# Patient Record
Sex: Male | Born: 1945 | Race: White | Hispanic: No | Marital: Married
Health system: Southern US, Community
[De-identification: ages and names within clinical notes are randomized; demographics above are authoritative.]

---

## 2016-10-13 ENCOUNTER — Emergency Department (HOSPITAL_COMMUNITY): Payer: Medicare Other

## 2016-10-13 ENCOUNTER — Encounter (HOSPITAL_COMMUNITY): Payer: Self-pay | Admitting: Radiology

## 2016-10-13 ENCOUNTER — Emergency Department (HOSPITAL_COMMUNITY)
Admission: EM | Admit: 2016-10-13 | Discharge: 2016-10-14 | Disposition: A | Discharge status: Home / Self Care | Payer: Medicare Other | Attending: Emergency Medicine | Admitting: Emergency Medicine

## 2016-10-13 DIAGNOSIS — R531 Weakness: Secondary | ICD-10-CM | POA: Diagnosis present

## 2016-10-13 DIAGNOSIS — D696 Thrombocytopenia, unspecified: Secondary | ICD-10-CM | POA: Insufficient documentation

## 2016-10-13 DIAGNOSIS — W57XXXA Bitten or stung by nonvenomous insect and other nonvenomous arthropods, initial encounter: Secondary | ICD-10-CM | POA: Insufficient documentation

## 2016-10-13 DIAGNOSIS — R509 Fever, unspecified: Secondary | ICD-10-CM | POA: Diagnosis not present

## 2016-10-13 LAB — URINALYSIS, ROUTINE W REFLEX MICROSCOPIC
BILIRUBIN URINE: NEGATIVE
GLUCOSE, UA: NEGATIVE mg/dL
HGB URINE DIPSTICK: NEGATIVE
KETONES UR: 20 mg/dL — AB
Leukocytes, UA: NEGATIVE
Nitrite: NEGATIVE
PH: 5 (ref 5.0–8.0)
Protein, ur: NEGATIVE mg/dL
SPECIFIC GRAVITY, URINE: 1.018 (ref 1.005–1.030)

## 2016-10-13 LAB — COMPREHENSIVE METABOLIC PANEL
ALBUMIN: 3.7 g/dL (ref 3.5–5.0)
ALK PHOS: 59 U/L (ref 38–126)
ALT: 20 U/L (ref 17–63)
AST: 27 U/L (ref 15–41)
Anion gap: 8 (ref 5–15)
BUN: 11 mg/dL (ref 6–20)
CALCIUM: 8.6 mg/dL — AB (ref 8.9–10.3)
CHLORIDE: 101 mmol/L (ref 101–111)
CO2: 25 mmol/L (ref 22–32)
CREATININE: 1.24 mg/dL (ref 0.61–1.24)
GFR calc non Af Amer: 57 mL/min — ABNORMAL LOW (ref >60)
GLUCOSE: 132 mg/dL — AB (ref 65–99)
Potassium: 4 mmol/L (ref 3.5–5.1)
SODIUM: 134 mmol/L — AB (ref 135–145)
Total Bilirubin: 1.5 mg/dL — ABNORMAL HIGH (ref 0.3–1.2)
Total Protein: 6.7 g/dL (ref 6.5–8.1)

## 2016-10-13 LAB — CBC WITH DIFFERENTIAL/PLATELET
BASOS ABS: 0 10*3/uL (ref 0.0–0.1)
Basophils Relative: 1 %
EOS ABS: 0 10*3/uL (ref 0.0–0.7)
Eosinophils Relative: 0 %
HCT: 43.4 % (ref 39.0–52.0)
Hemoglobin: 14.8 g/dL (ref 13.0–17.0)
LYMPHS ABS: 0.4 10*3/uL — AB (ref 0.7–4.0)
Lymphocytes Relative: 13 %
MCH: 29.7 pg (ref 26.0–34.0)
MCHC: 34.1 g/dL (ref 30.0–36.0)
MCV: 87 fL (ref 78.0–100.0)
Monocytes Absolute: 0.2 10*3/uL (ref 0.1–1.0)
Monocytes Relative: 6 %
NEUTROS PCT: 79 %
Neutro Abs: 2.4 10*3/uL (ref 1.7–7.7)
Platelets: 97 10*3/uL — ABNORMAL LOW (ref 150–400)
RBC: 4.99 MIL/uL (ref 4.22–5.81)
RDW: 13.1 % (ref 11.5–15.5)
WBC: 3 10*3/uL — AB (ref 4.0–10.5)

## 2016-10-13 LAB — LIPASE, BLOOD: Lipase: 30 U/L (ref 11–51)

## 2016-10-13 LAB — TROPONIN I: Troponin I: <0.03 ng/mL (ref <0.03)

## 2016-10-13 MED ORDER — SODIUM CHLORIDE 0.9 % IV BOLUS (SEPSIS)
1000.0000 mL | Freq: Once | INTRAVENOUS | Status: AC
  Administered 2016-10-13: 1000 mL via INTRAVENOUS

## 2016-10-13 MED ORDER — ACETAMINOPHEN 500 MG PO TABS
1000.0000 mg | ORAL_TABLET | Freq: Once | ORAL | Status: AC
  Administered 2016-10-13: 1000 mg via ORAL
  Filled 2016-10-13: qty 2

## 2016-10-13 MED ORDER — DOXYCYCLINE HYCLATE 100 MG IV SOLR
100.0000 mg | Freq: Once | INTRAVENOUS | Status: AC
  Administered 2016-10-13: 100 mg via INTRAVENOUS
  Filled 2016-10-13: qty 100

## 2016-10-13 MED ORDER — SODIUM CHLORIDE 0.9 % IV BOLUS (SEPSIS)
500.0000 mL | Freq: Once | INTRAVENOUS | Status: AC
  Administered 2016-10-13: 500 mL via INTRAVENOUS

## 2016-10-13 MED ORDER — IOPAMIDOL (ISOVUE-300) INJECTION 61%
INTRAVENOUS | Status: AC
  Administered 2016-10-13: 100 mL
  Filled 2016-10-13: qty 100

## 2016-10-13 NOTE — ED Triage Notes (Addendum)
Pt presents with generalized weakness, headache and L flank pain x 3-4 days.  Pt denies any back injury, was seen at [facility] yesterday, placed on zofran and doxy for possible lyme disease.  Pt reports over the past few months, he has been bitten x 4 by ticks.  Pt reports syncopal episode while at doctor's office yesterday, worked up at [employer] and referred here after discharge.

## 2016-10-13 NOTE — ED Provider Notes (Addendum)
[place] DEPT Provider Note   CSN: [id number] Arrival date & time: 10/13/16  1222     History   Chief Complaint Chief Complaint  Patient presents with  . Weakness    HPI Donald Potter is a 71 y.o. male.  Patient c/o generalized weakness for the past 3-4 days.  C/o intermittent dull frontal headaches, general/intermittent abdominal discomfort, nausea, decreased appetite. Denies vomiting or diarrhea. Denies dysuria or gu c/o. No prior abd surgery. Pt denies neck pain or stiffness. No chest pain or discomfort. Occasional non prod cough. No sore throat. Denies sinus pain. No skin rash/lesions. States had a few tick bites 3 months ago.  Denies rash. Denies joint pain. No fever or chills. No recent change in meds. States went to [facility] yesterday, no specific dx made.    The history is provided by the patient.  Weakness  Pertinent negatives include no shortness of breath, no chest pain, no vomiting, no confusion and no headaches.    No past medical history on file.  There are no active problems to display for this patient.   No past surgical history on file.     Home Medications    Prior to Admission medications   Not on File    Family History No family history on file.  Social History Social History  Substance Use Topics  . Smoking status: Not on file  . Smokeless tobacco: Not on file  . Alcohol use Not on file     Allergies   Patient has no allergy information on record.   Review of Systems Review of Systems  Constitutional: Negative for chills and fever.  HENT: Negative for sore throat.   Eyes: Negative for redness and visual disturbance.  Respiratory: Positive for cough. Negative for shortness of breath.   Cardiovascular: Negative for chest pain.  Gastrointestinal: Positive for nausea. Negative for diarrhea and vomiting.  Endocrine: Negative for polyuria.  Genitourinary: Negative for dysuria and flank pain.  Musculoskeletal: Negative for back  pain and neck pain.  Skin: Negative for rash.  Neurological: Positive for weakness. Negative for headaches.  Hematological: Does not bruise/bleed easily.  Psychiatric/Behavioral: Negative for confusion.     Physical Exam Updated Vital Signs BP 106/88   Pulse (!) 102   Temp 98.4 F (36.9 C) (Oral)   Resp 10   Ht 1.829 m (6')   Wt 85.7 kg (189 lb)   SpO2 100%   BMI 25.63 kg/m   Physical Exam  Constitutional: He is oriented to person, place, and time. He appears well-developed and well-nourished. No distress.  HENT:  Head: Atraumatic.  Nose: Nose normal.  Mouth/Throat: Oropharynx is clear and moist.  No sinus or temporal tenderness.   Eyes: Pupils are equal, round, and reactive to light. Conjunctivae are normal. No scleral icterus.  Neck: Neck supple. No tracheal deviation present. No thyromegaly present.  No stiffness or rigidity  Cardiovascular: Regular rhythm, normal heart sounds and intact distal pulses.  Exam reveals no gallop and no friction rub.   No murmur heard. Mildly tachy.   Pulmonary/Chest: Effort normal and breath sounds normal. No accessory muscle usage. No respiratory distress.  Abdominal: Soft. Bowel sounds are normal. He exhibits no distension and no mass. There is no tenderness. There is no rebound and no guarding. No hernia.  Genitourinary:  Genitourinary Comments: No cva tenderness  Musculoskeletal: He exhibits no edema.  CTLS spine, non tender, aligned, no step off. No focal bony or joint pain or tenderness.  Lymphadenopathy:    He has no cervical adenopathy.  Neurological: He is alert and oriented to person, place, and time.  Speech clear/fluent. Motor intact bil, steady gait.   Skin: Skin is warm and dry. No rash noted. He is not diaphoretic.  No petechia. No JWL, no splinter hem.   Psychiatric: He has a normal mood and affect.  Nursing note and vitals reviewed.    ED Treatments / Results  Labs (all labs ordered are listed, but only abnormal  results are displayed)  Results for orders placed or performed during the hospital encounter of 10/13/16  CBC with Differential  Result Value Ref Range   WBC 3.0 (L) 4.0 - 10.5 K/uL   RBC 4.99 4.22 - 5.81 MIL/uL   Hemoglobin 14.8 13.0 - 17.0 g/dL   HCT 43.4 39.0 - 52.0 %   MCV 87.0 78.0 - 100.0 fL   MCH 29.7 26.0 - 34.0 pg   MCHC 34.1 30.0 - 36.0 g/dL   RDW 13.1 11.5 - 15.5 %   Platelets 97 (L) 150 - 400 K/uL   Neutrophils Relative % 79 %   Neutro Abs 2.4 1.7 - 7.7 K/uL   Lymphocytes Relative 13 %   Lymphs Abs 0.4 (L) 0.7 - 4.0 K/uL   Monocytes Relative 6 %   Monocytes Absolute 0.2 0.1 - 1.0 K/uL   Eosinophils Relative 0 %   Eosinophils Absolute 0.0 0.0 - 0.7 K/uL   Basophils Relative 1 %   Basophils Absolute 0.0 0.0 - 0.1 K/uL  Comprehensive metabolic panel  Result Value Ref Range   Sodium 134 (L) 135 - 145 mmol/L   Potassium 4.0 3.5 - 5.1 mmol/L   Chloride 101 101 - 111 mmol/L   CO2 25 22 - 32 mmol/L   Glucose, Bld 132 (H) 65 - 99 mg/dL   BUN 11 6 - 20 mg/dL   Creatinine, Ser 1.24 0.61 - 1.24 mg/dL   Calcium 8.6 (L) 8.9 - 10.3 mg/dL   Total Protein 6.7 6.5 - 8.1 g/dL   Albumin 3.7 3.5 - 5.0 g/dL   AST 27 15 - 41 U/L   ALT 20 17 - 63 U/L   Alkaline Phosphatase 59 38 - 126 U/L   Total Bilirubin 1.5 (H) 0.3 - 1.2 mg/dL   GFR calc non Af Amer 57 (L) >60 mL/min   GFR calc Af Amer >60 >60 mL/min   Anion gap 8 5 - 15  Urinalysis, Routine w reflex microscopic  Result Value Ref Range   Color, Urine YELLOW YELLOW   APPearance CLEAR CLEAR   Specific Gravity, Urine 1.018 1.005 - 1.030   pH 5.0 5.0 - 8.0   Glucose, UA NEGATIVE NEGATIVE mg/dL   Hgb urine dipstick NEGATIVE NEGATIVE   Bilirubin Urine NEGATIVE NEGATIVE   Ketones, ur 20 (A) NEGATIVE mg/dL   Protein, ur NEGATIVE NEGATIVE mg/dL   Nitrite NEGATIVE NEGATIVE   Leukocytes, UA NEGATIVE NEGATIVE  Lipase, blood  Result Value Ref Range   Lipase 30 11 - 51 U/L  Troponin I  Result Value Ref Range   Troponin I <0.03  <0.03 ng/mL   Ct Head Wo Contrast  Result Date: 10/13/2016 CLINICAL DATA:  Headache. Body weakness for 4 days. Syncope yesterday. EXAM: CT HEAD WITHOUT CONTRAST TECHNIQUE: Contiguous axial images were obtained from the base of the skull through the vertex without intravenous contrast. COMPARISON:  Head CT 09/14/2014 FINDINGS: Brain: No intracranial hemorrhage, mass effect, or midline shift. No hydrocephalus. The basilar cisterns  are patent. No evidence of territorial infarct. Mild chronic small vessel ischemia is normal for age. No extra-axial or intracranial fluid collection. Vascular: No hyperdense vessel or unexpected calcification. Skull: Normal. Negative for fracture or focal lesion. Sinuses/Orbits: Paranasal sinuses and mastoid air cells are clear. The visualized orbits are unremarkable. Other: None. IMPRESSION: No acute intracranial abnormality. Electronically Signed   By: Jeb Levering M.D.   On: 10/13/2016 21:14   Ct Abdomen Pelvis W Contrast  Result Date: 10/13/2016 CLINICAL DATA:  Initial evaluation for acute left-sided flank pain with generalized weakness for 4 days. EXAM: CT ABDOMEN AND PELVIS WITH CONTRAST TECHNIQUE: Multidetector CT imaging of the abdomen and pelvis was performed using the standard protocol following bolus administration of intravenous contrast. CONTRAST:  177mL ISOVUE-300 IOPAMIDOL (ISOVUE-300) INJECTION 61% COMPARISON:  Prior CT from 12/06/2015. FINDINGS: Lower chest: Mild subsegmental atelectatic changes present dependently within the visualized lung bases. Visualized lung bases are otherwise clear. Hepatobiliary: Subcentimeter hypodensity noted within the left hepatic lobe, indeterminate, but of doubtful significance. Liver otherwise unremarkable. Gallbladder within normal limits. No biliary dilatation. Pancreas: Pancreas within normal limits. Spleen: Spleen within normal limits. Adrenals/Urinary Tract: Adrenal glands are normal. Kidneys equal in size with symmetric  enhancement. Subcentimeter hypodensity within left kidney too small the characterize, but statistically likely reflects a small cyst. No nephrolithiasis, hydronephrosis, or focal enhancing renal mass. No hydroureter. Bladder within normal limits. Stomach/Bowel: Stomach within normal limits. No evidence for bowel obstruction. Appendix is normal. Colonic diverticulosis without evidence for acute diverticulitis. No acute inflammatory changes seen about the bowels. Vascular/Lymphatic: Moderate aorto bi-iliac atherosclerotic disease. No aneurysm. Normal intravascular enhancement seen throughout the intra-abdominal aorta and its branch vessels. No adenopathy. Reproductive: Prostate enlarged measuring 5.8 cm in transverse diameter. Other: Small bilateral fat containing inguinal hernias noted. No free air or fluid. Musculoskeletal: No acute osseus abnormality. No worrisome lytic or blastic osseous lesions. IMPRESSION: 1. No CT evidence for acute intra-abdominal or pelvic process. 2. Colonic diverticulosis without evidence for acute diverticulitis. 3. Moderate aorto bi-iliac atherosclerotic disease. 4. Enlarged prostate. Electronically Signed   By: Jeannine Boga M.D.   On: 10/13/2016 21:22   Dg Abd Acute W/chest  Result Date: 10/13/2016 CLINICAL DATA:  Pain and nausea EXAM: DG ABDOMEN ACUTE W/ 1V CHEST COMPARISON:  10/12/2016 FINDINGS: Normal heart size and mediastinal contours. No acute infiltrate or edema. No effusion or pneumothorax. No acute osseous findings. Normal bowel gas pattern. No concerning mass effect or calcification. EKG leads create artifact over the upper abdomen lower chest. IMPRESSION: Negative abdominal radiographs.  No acute cardiopulmonary disease. Electronically Signed   By: Monte Fantasia M.D.   On: 10/13/2016 16:15       EKG  EKG Interpretation None       Radiology Ct Head Wo Contrast  Result Date: 10/13/2016 CLINICAL DATA:  Headache. Body weakness for 4 days. Syncope  yesterday. EXAM: CT HEAD WITHOUT CONTRAST TECHNIQUE: Contiguous axial images were obtained from the base of the skull through the vertex without intravenous contrast. COMPARISON:  Head CT 09/14/2014 FINDINGS: Brain: No intracranial hemorrhage, mass effect, or midline shift. No hydrocephalus. The basilar cisterns are patent. No evidence of territorial infarct. Mild chronic small vessel ischemia is normal for age. No extra-axial or intracranial fluid collection. Vascular: No hyperdense vessel or unexpected calcification. Skull: Normal. Negative for fracture or focal lesion. Sinuses/Orbits: Paranasal sinuses and mastoid air cells are clear. The visualized orbits are unremarkable. Other: None. IMPRESSION: No acute intracranial abnormality. Electronically Signed   By: Threasa Beards  Ehinger M.D.   On: 10/13/2016 21:14   Ct Abdomen Pelvis W Contrast  Result Date: 10/13/2016 CLINICAL DATA:  Initial evaluation for acute left-sided flank pain with generalized weakness for 4 days. EXAM: CT ABDOMEN AND PELVIS WITH CONTRAST TECHNIQUE: Multidetector CT imaging of the abdomen and pelvis was performed using the standard protocol following bolus administration of intravenous contrast. CONTRAST:  195mL ISOVUE-300 IOPAMIDOL (ISOVUE-300) INJECTION 61% COMPARISON:  Prior CT from 12/06/2015. FINDINGS: Lower chest: Mild subsegmental atelectatic changes present dependently within the visualized lung bases. Visualized lung bases are otherwise clear. Hepatobiliary: Subcentimeter hypodensity noted within the left hepatic lobe, indeterminate, but of doubtful significance. Liver otherwise unremarkable. Gallbladder within normal limits. No biliary dilatation. Pancreas: Pancreas within normal limits. Spleen: Spleen within normal limits. Adrenals/Urinary Tract: Adrenal glands are normal. Kidneys equal in size with symmetric enhancement. Subcentimeter hypodensity within left kidney too small the characterize, but statistically likely reflects a  small cyst. No nephrolithiasis, hydronephrosis, or focal enhancing renal mass. No hydroureter. Bladder within normal limits. Stomach/Bowel: Stomach within normal limits. No evidence for bowel obstruction. Appendix is normal. Colonic diverticulosis without evidence for acute diverticulitis. No acute inflammatory changes seen about the bowels. Vascular/Lymphatic: Moderate aorto bi-iliac atherosclerotic disease. No aneurysm. Normal intravascular enhancement seen throughout the intra-abdominal aorta and its branch vessels. No adenopathy. Reproductive: Prostate enlarged measuring 5.8 cm in transverse diameter. Other: Small bilateral fat containing inguinal hernias noted. No free air or fluid. Musculoskeletal: No acute osseus abnormality. No worrisome lytic or blastic osseous lesions. IMPRESSION: 1. No CT evidence for acute intra-abdominal or pelvic process. 2. Colonic diverticulosis without evidence for acute diverticulitis. 3. Moderate aorto bi-iliac atherosclerotic disease. 4. Enlarged prostate. Electronically Signed   By: Jeannine Boga M.D.   On: 10/13/2016 21:22   Dg Abd Acute W/chest  Result Date: 10/13/2016 CLINICAL DATA:  Pain and nausea EXAM: DG ABDOMEN ACUTE W/ 1V CHEST COMPARISON:  10/12/2016 FINDINGS: Normal heart size and mediastinal contours. No acute infiltrate or edema. No effusion or pneumothorax. No acute osseous findings. Normal bowel gas pattern. No concerning mass effect or calcification. EKG leads create artifact over the upper abdomen lower chest. IMPRESSION: Negative abdominal radiographs.  No acute cardiopulmonary disease. Electronically Signed   By: Monte Fantasia M.D.   On: 10/13/2016 16:15    Procedures Procedures (including critical care time)  Medications Ordered in ED Medications  sodium chloride 0.9 % bolus 1,000 mL (not administered)     Initial Impression / Assessment and Plan / ED Course  I have reviewed the triage vital signs and the nursing notes.  Pertinent  labs & imaging results that were available during my care of the patient were reviewed by me and considered in my medical decision making (see chart for details).  Iv ns bolus. Labs.  Reviewed nursing notes and prior charts for additional history.   Ns boluses.   Family indicates pcp had wanted CT scan.  CT scans negative for acute process.   Given multiple tick bites in past couple months, fever in ED, no clear source infection - will add cultures, lyme/RMSF.    Doxycycline iv. Additional ivf.   Recheck. Pt comfortable. V mild headache. No neck stiffness or rigidity. Chest cta, no increased wob. abd soft, no focal tenderness. Spine non tender, aligned.   Pt feels improved from prior.  Remains unclear definitive source infxn, viral vs tick related vs other.   Pt/fam indicates just filled doxy and additional abx for home/has picked up already.  Recheck hr 92, rr  16. Pulse ox 95%.   Rec close pcp follow up this Monday.  Return precautions provided.     Final Clinical Impressions(s) / ED Diagnoses   Final diagnoses:  None    New Prescriptions New Prescriptions   No medications on file         Lajean Saver, MD 10/13/16 2233

## 2016-10-13 NOTE — ED Notes (Signed)
Pt given water and applesauce  

## 2016-10-13 NOTE — Discharge Instructions (Addendum)
It was our pleasure to provide your ER care today - we hope that you feel better.  Take your antibiotics.    Rest. Drink plenty of fluids.  Take acetaminophen as need for fever.   From today's lab tests, your platelet count is low (97) and bilirubin level slightly high (1.5) - follow up with your doctor.   We sent some blood work the results of which will be back in a few days (tick-related infection tests, and blood cultures) - have your doctor follow up on those results.  Please follow up with your doctor this Monday for recheck.   Return to ER if worse, new symptoms, trouble breathing, persistent vomiting, new or severe pain, other concern.

## 2016-10-13 NOTE — ED Notes (Signed)
Patient transported to X-ray 

## 2016-10-14 NOTE — ED Notes (Signed)
Pt verbalized understanding of d/c instructions and has no further questions. Pt is stable, A&Ox4, VSS.  

## 2016-10-18 LAB — CULTURE, BLOOD (ROUTINE X 2)
Culture: NO GROWTH
Culture: NO GROWTH
Special Requests: ADEQUATE
Special Requests: ADEQUATE

## 2016-10-19 LAB — B. BURGDORFI ANTIBODIES: B burgdorferi Ab IgG+IgM: <0.91 {ISR} (ref 0.00–0.90)

## 2016-10-24 LAB — ROCKY MTN SPOTTED FVR ABS PNL(IGG+IGM)
RMSF IGG: NEGATIVE
RMSF IgM: 0.17 index (ref 0.00–0.89)

## 2017-12-01 IMAGING — CT CT HEAD W/O CM
4 series · 16 of 47 positions shown, 18 images · non-contrast
Comparison: Head CT 09/14/2014

CLINICAL DATA: Headache. Body weakness for 4 days. Syncope
yesterday.

EXAM:
CT HEAD WITHOUT CONTRAST
TECHNIQUE: Contiguous axial images were obtained from the base of the skull
through the vertex without intravenous contrast.

[Series 4: head wo · axial · 0.41mm/px · z∈[+135,+255]mm · 7 of 33 slices shown, 9 images]
[im 5/33  brain]
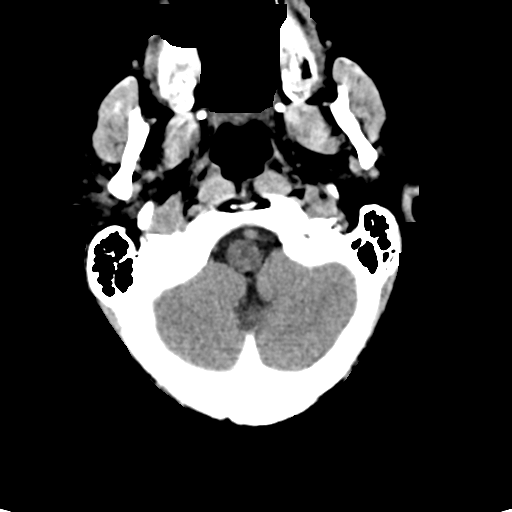
[im 5/33  bone]
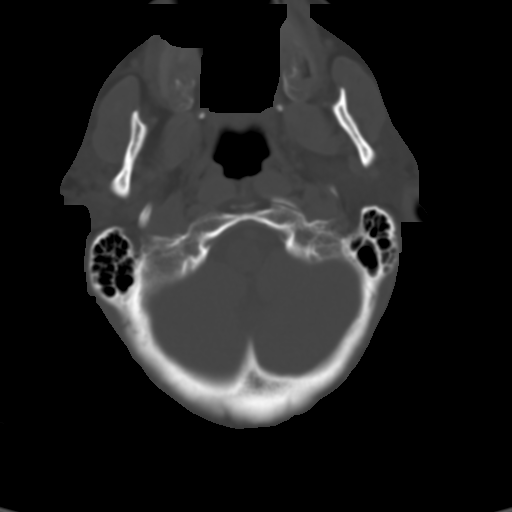
[im 9/33  brain]
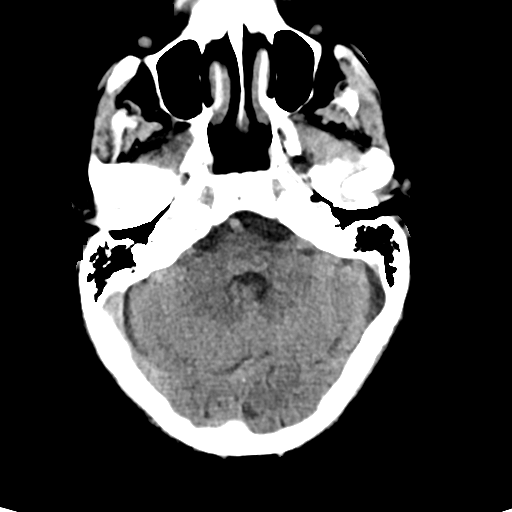
[im 13/33  brain]
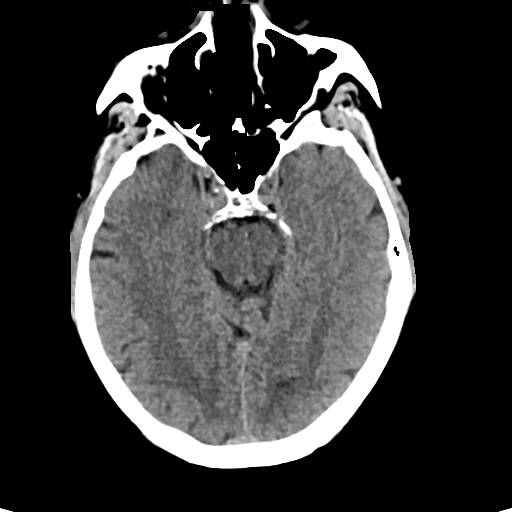
[im 17/33  brain]
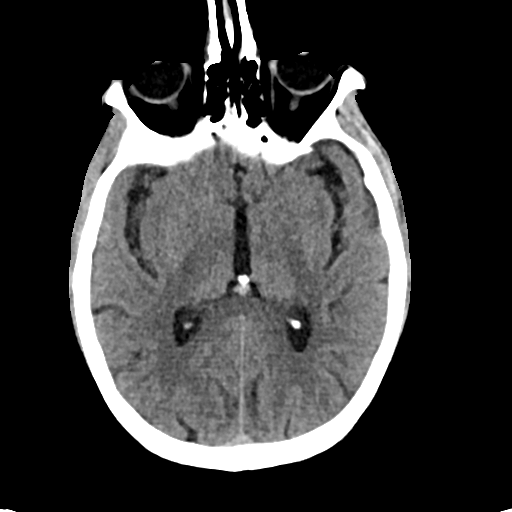
[im 21/33  brain]
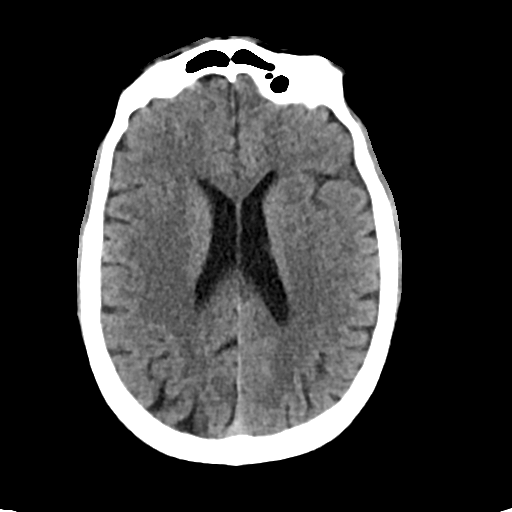
[im 21/33  bone]
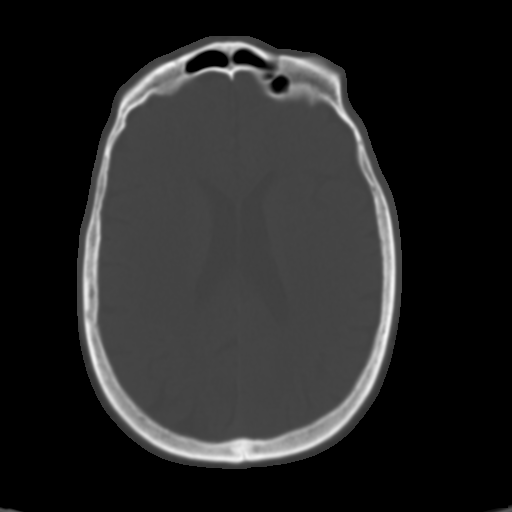
[im 25/33  brain]
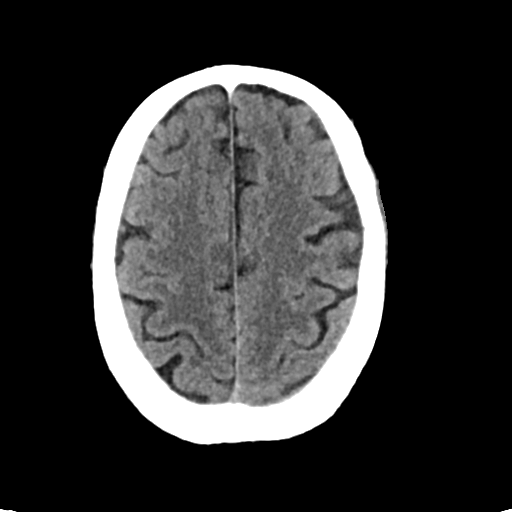
[im 29/33  brain]
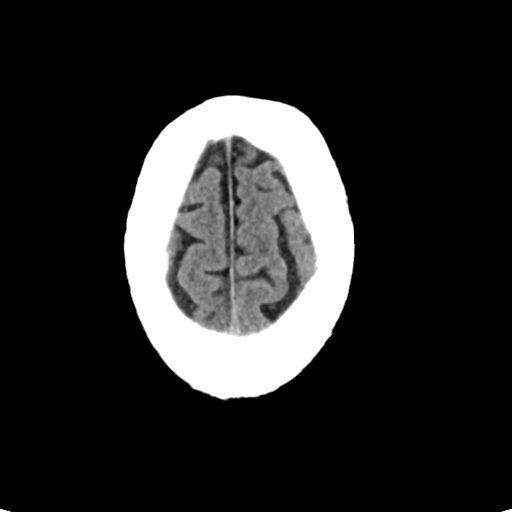

[Series 5: head bone · axial · 0.41mm/px · z∈[+131,+163]mm · 3 of 81 slices shown]
[im 9/81  bone]
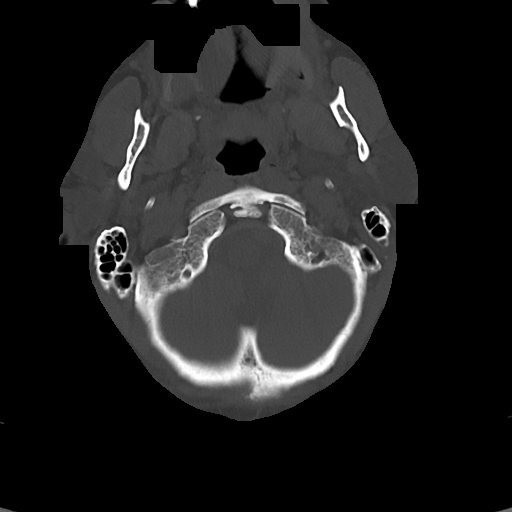
[im 17/81  bone]
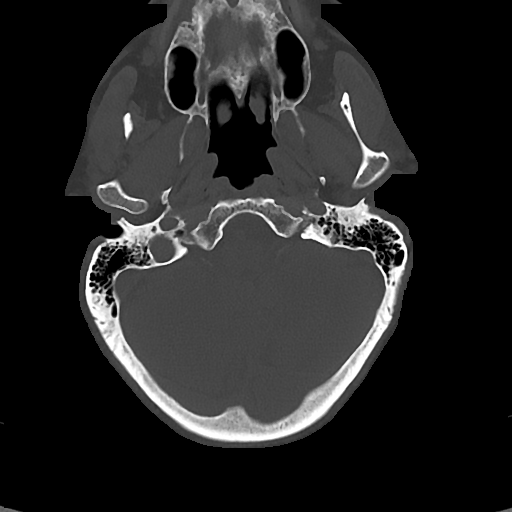
[im 25/81  bone]
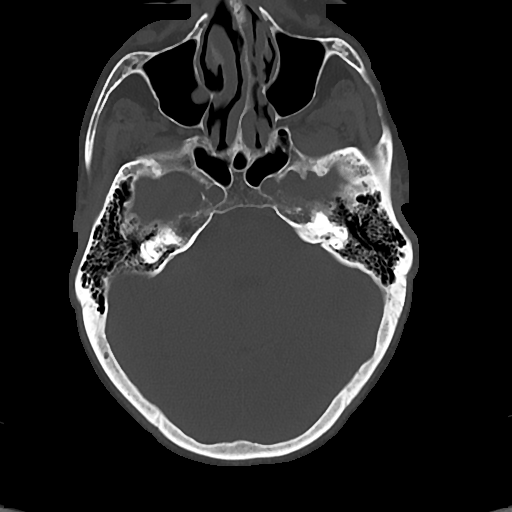

[Series 6: cor soft · coronal · 0.32mm/px · 3 of 67 slices shown]
[im 26/67  brain]
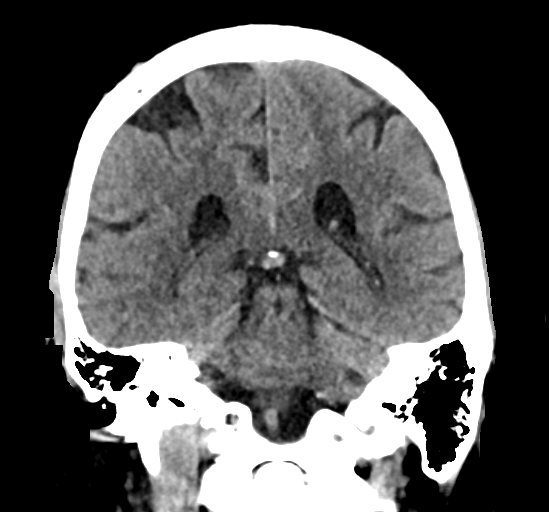
[im 31/67  brain]
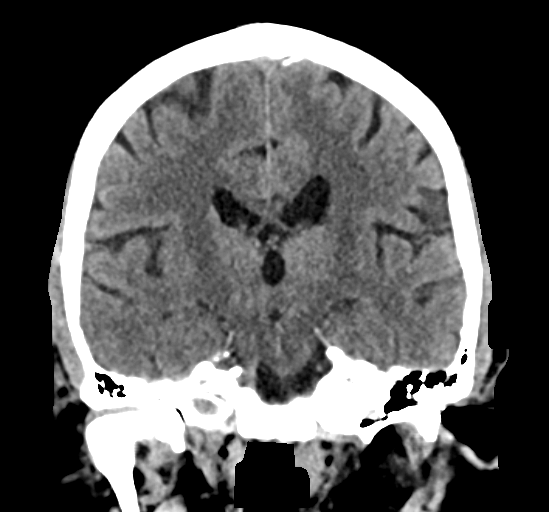
[im 36/67  brain]
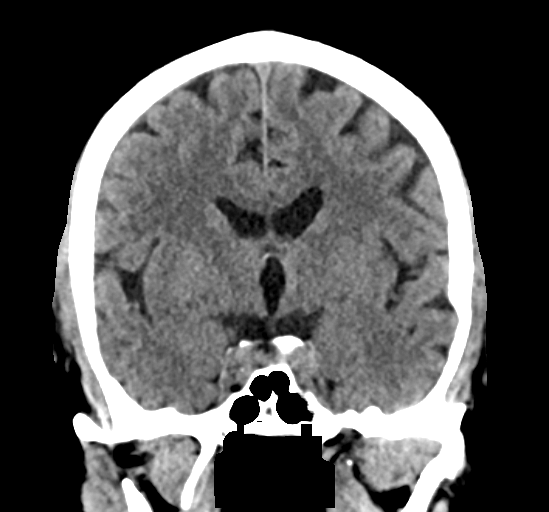

[Series 7: sag soft · sagittal · 0.31mm/px · 3 of 55 slices shown]
[im 19/55  brain]
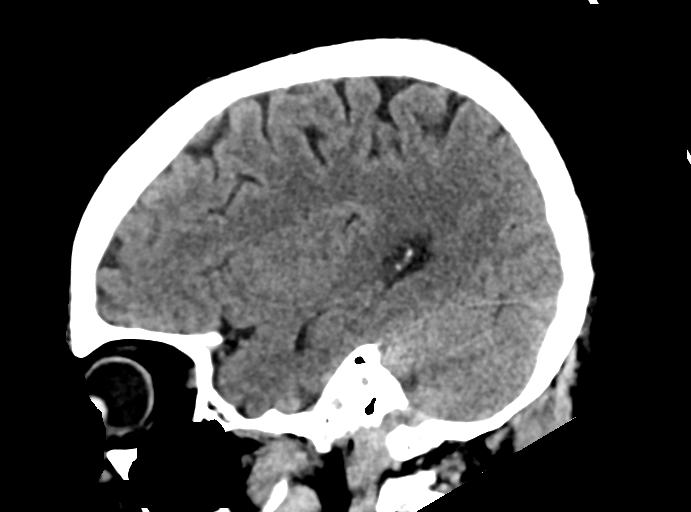
[im 28/55  brain]
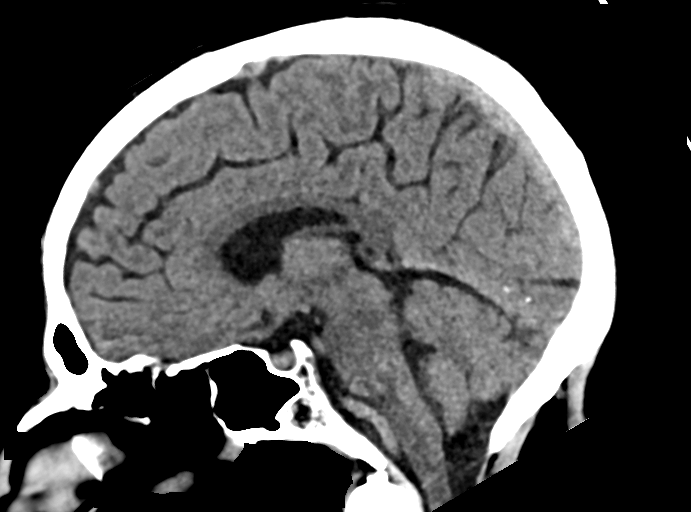
[im 37/55  brain]
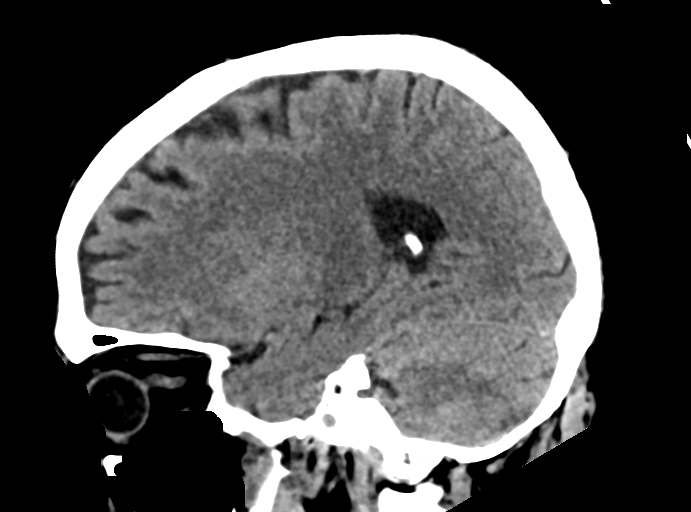

[16 of 47 positions shown; findings below may reference images not displayed]

FINDINGS: Brain: No intracranial hemorrhage, mass effect, or midline shift. No
hydrocephalus. The basilar cisterns are patent. No evidence of
territorial infarct. Mild chronic small vessel ischemia is normal
for age. No extra-axial or intracranial fluid collection.

Vascular: No hyperdense vessel or unexpected calcification.

Skull: Normal. Negative for fracture or focal lesion.

Sinuses/Orbits: Paranasal sinuses and mastoid air cells are clear.
The visualized orbits are unremarkable.

Other: None.
IMPRESSION: No acute intracranial abnormality.

## 2019-04-07 DIAGNOSIS — I10 Essential (primary) hypertension: Secondary | ICD-10-CM | POA: Diagnosis not present

## 2019-04-07 DIAGNOSIS — E7800 Pure hypercholesterolemia, unspecified: Secondary | ICD-10-CM | POA: Diagnosis not present

## 2019-04-22 DIAGNOSIS — I10 Essential (primary) hypertension: Secondary | ICD-10-CM | POA: Diagnosis not present

## 2019-05-25 DIAGNOSIS — I10 Essential (primary) hypertension: Secondary | ICD-10-CM | POA: Diagnosis not present

## 2019-05-27 DIAGNOSIS — E7800 Pure hypercholesterolemia, unspecified: Secondary | ICD-10-CM | POA: Diagnosis not present

## 2019-05-27 DIAGNOSIS — I10 Essential (primary) hypertension: Secondary | ICD-10-CM | POA: Diagnosis not present

## 2019-05-29 DIAGNOSIS — Z1211 Encounter for screening for malignant neoplasm of colon: Secondary | ICD-10-CM | POA: Diagnosis not present

## 2019-05-29 DIAGNOSIS — Z299 Encounter for prophylactic measures, unspecified: Secondary | ICD-10-CM | POA: Diagnosis not present

## 2019-05-29 DIAGNOSIS — Z79899 Other long term (current) drug therapy: Secondary | ICD-10-CM | POA: Diagnosis not present

## 2019-05-29 DIAGNOSIS — I10 Essential (primary) hypertension: Secondary | ICD-10-CM | POA: Diagnosis not present

## 2019-05-29 DIAGNOSIS — E7800 Pure hypercholesterolemia, unspecified: Secondary | ICD-10-CM | POA: Diagnosis not present

## 2019-05-29 DIAGNOSIS — Z0000 Encounter for general adult medical examination without abnormal findings: Secondary | ICD-10-CM | POA: Diagnosis not present

## 2019-05-29 DIAGNOSIS — Z7189 Other specified counseling: Secondary | ICD-10-CM | POA: Diagnosis not present

## 2019-06-24 DIAGNOSIS — I10 Essential (primary) hypertension: Secondary | ICD-10-CM | POA: Diagnosis not present

## 2019-07-16 DIAGNOSIS — E7800 Pure hypercholesterolemia, unspecified: Secondary | ICD-10-CM | POA: Diagnosis not present

## 2019-07-16 DIAGNOSIS — I10 Essential (primary) hypertension: Secondary | ICD-10-CM | POA: Diagnosis not present

## 2019-07-25 DIAGNOSIS — I10 Essential (primary) hypertension: Secondary | ICD-10-CM | POA: Diagnosis not present

## 2019-08-24 DIAGNOSIS — I10 Essential (primary) hypertension: Secondary | ICD-10-CM | POA: Diagnosis not present

## 2019-08-24 DIAGNOSIS — E7800 Pure hypercholesterolemia, unspecified: Secondary | ICD-10-CM | POA: Diagnosis not present

## 2019-09-01 DIAGNOSIS — E7800 Pure hypercholesterolemia, unspecified: Secondary | ICD-10-CM | POA: Diagnosis not present

## 2019-09-01 DIAGNOSIS — F17210 Nicotine dependence, cigarettes, uncomplicated: Secondary | ICD-10-CM | POA: Diagnosis not present

## 2019-09-01 DIAGNOSIS — Z299 Encounter for prophylactic measures, unspecified: Secondary | ICD-10-CM | POA: Diagnosis not present

## 2019-09-01 DIAGNOSIS — I10 Essential (primary) hypertension: Secondary | ICD-10-CM | POA: Diagnosis not present

## 2019-09-24 DIAGNOSIS — I10 Essential (primary) hypertension: Secondary | ICD-10-CM | POA: Diagnosis not present

## 2019-09-24 DIAGNOSIS — E7800 Pure hypercholesterolemia, unspecified: Secondary | ICD-10-CM | POA: Diagnosis not present

## 2019-10-24 DIAGNOSIS — I10 Essential (primary) hypertension: Secondary | ICD-10-CM | POA: Diagnosis not present

## 2019-10-24 DIAGNOSIS — E7800 Pure hypercholesterolemia, unspecified: Secondary | ICD-10-CM | POA: Diagnosis not present

## 2019-10-27 DIAGNOSIS — I10 Essential (primary) hypertension: Secondary | ICD-10-CM | POA: Diagnosis not present

## 2019-10-27 DIAGNOSIS — E7800 Pure hypercholesterolemia, unspecified: Secondary | ICD-10-CM | POA: Diagnosis not present

## 2019-11-25 DIAGNOSIS — I10 Essential (primary) hypertension: Secondary | ICD-10-CM | POA: Diagnosis not present

## 2019-12-25 DIAGNOSIS — I10 Essential (primary) hypertension: Secondary | ICD-10-CM | POA: Diagnosis not present

## 2019-12-25 DIAGNOSIS — E7800 Pure hypercholesterolemia, unspecified: Secondary | ICD-10-CM | POA: Diagnosis not present

## 2020-01-23 DIAGNOSIS — I10 Essential (primary) hypertension: Secondary | ICD-10-CM | POA: Diagnosis not present

## 2020-01-23 DIAGNOSIS — E7800 Pure hypercholesterolemia, unspecified: Secondary | ICD-10-CM | POA: Diagnosis not present

## 2020-01-24 DIAGNOSIS — I10 Essential (primary) hypertension: Secondary | ICD-10-CM | POA: Diagnosis not present

## 2020-02-24 DIAGNOSIS — E7800 Pure hypercholesterolemia, unspecified: Secondary | ICD-10-CM | POA: Diagnosis not present

## 2020-02-24 DIAGNOSIS — I10 Essential (primary) hypertension: Secondary | ICD-10-CM | POA: Diagnosis not present

## 2020-03-01 DIAGNOSIS — Z299 Encounter for prophylactic measures, unspecified: Secondary | ICD-10-CM | POA: Diagnosis not present

## 2020-03-01 DIAGNOSIS — F17210 Nicotine dependence, cigarettes, uncomplicated: Secondary | ICD-10-CM | POA: Diagnosis not present

## 2020-03-01 DIAGNOSIS — M549 Dorsalgia, unspecified: Secondary | ICD-10-CM | POA: Diagnosis not present

## 2020-03-01 DIAGNOSIS — I10 Essential (primary) hypertension: Secondary | ICD-10-CM | POA: Diagnosis not present

## 2020-03-25 DIAGNOSIS — E7800 Pure hypercholesterolemia, unspecified: Secondary | ICD-10-CM | POA: Diagnosis not present

## 2020-03-25 DIAGNOSIS — I10 Essential (primary) hypertension: Secondary | ICD-10-CM | POA: Diagnosis not present

## 2020-04-26 DIAGNOSIS — I10 Essential (primary) hypertension: Secondary | ICD-10-CM | POA: Diagnosis not present

## 2020-04-26 DIAGNOSIS — Z23 Encounter for immunization: Secondary | ICD-10-CM | POA: Diagnosis not present

## 2020-05-24 DIAGNOSIS — I10 Essential (primary) hypertension: Secondary | ICD-10-CM | POA: Diagnosis not present

## 2020-06-22 DIAGNOSIS — Z79899 Other long term (current) drug therapy: Secondary | ICD-10-CM | POA: Diagnosis not present

## 2020-06-22 DIAGNOSIS — Z0000 Encounter for general adult medical examination without abnormal findings: Secondary | ICD-10-CM | POA: Diagnosis not present

## 2020-06-22 DIAGNOSIS — Z299 Encounter for prophylactic measures, unspecified: Secondary | ICD-10-CM | POA: Diagnosis not present

## 2020-06-22 DIAGNOSIS — F17210 Nicotine dependence, cigarettes, uncomplicated: Secondary | ICD-10-CM | POA: Diagnosis not present

## 2020-06-22 DIAGNOSIS — E7800 Pure hypercholesterolemia, unspecified: Secondary | ICD-10-CM | POA: Diagnosis not present

## 2020-06-22 DIAGNOSIS — R5383 Other fatigue: Secondary | ICD-10-CM | POA: Diagnosis not present

## 2020-06-22 DIAGNOSIS — I10 Essential (primary) hypertension: Secondary | ICD-10-CM | POA: Diagnosis not present

## 2020-06-22 DIAGNOSIS — Z7189 Other specified counseling: Secondary | ICD-10-CM | POA: Diagnosis not present

## 2020-06-24 DIAGNOSIS — I10 Essential (primary) hypertension: Secondary | ICD-10-CM | POA: Diagnosis not present

## 2020-07-23 DIAGNOSIS — I10 Essential (primary) hypertension: Secondary | ICD-10-CM | POA: Diagnosis not present

## 2020-08-24 DIAGNOSIS — I10 Essential (primary) hypertension: Secondary | ICD-10-CM | POA: Diagnosis not present

## 2020-09-23 DIAGNOSIS — I10 Essential (primary) hypertension: Secondary | ICD-10-CM | POA: Diagnosis not present

## 2020-10-22 DIAGNOSIS — I10 Essential (primary) hypertension: Secondary | ICD-10-CM | POA: Diagnosis not present

## 2020-10-24 DIAGNOSIS — I10 Essential (primary) hypertension: Secondary | ICD-10-CM | POA: Diagnosis not present

## 2020-11-24 DIAGNOSIS — I10 Essential (primary) hypertension: Secondary | ICD-10-CM | POA: Diagnosis not present

## 2020-12-24 DIAGNOSIS — I10 Essential (primary) hypertension: Secondary | ICD-10-CM | POA: Diagnosis not present

## 2020-12-27 DIAGNOSIS — Z299 Encounter for prophylactic measures, unspecified: Secondary | ICD-10-CM | POA: Diagnosis not present

## 2020-12-27 DIAGNOSIS — Z23 Encounter for immunization: Secondary | ICD-10-CM | POA: Diagnosis not present

## 2020-12-27 DIAGNOSIS — D499 Neoplasm of unspecified behavior of unspecified site: Secondary | ICD-10-CM | POA: Diagnosis not present

## 2020-12-27 DIAGNOSIS — F17210 Nicotine dependence, cigarettes, uncomplicated: Secondary | ICD-10-CM | POA: Diagnosis not present

## 2020-12-27 DIAGNOSIS — I10 Essential (primary) hypertension: Secondary | ICD-10-CM | POA: Diagnosis not present

## 2020-12-27 DIAGNOSIS — K529 Noninfective gastroenteritis and colitis, unspecified: Secondary | ICD-10-CM | POA: Diagnosis not present

## 2021-01-24 DIAGNOSIS — I10 Essential (primary) hypertension: Secondary | ICD-10-CM | POA: Diagnosis not present

## 2021-01-28 DIAGNOSIS — D499 Neoplasm of unspecified behavior of unspecified site: Secondary | ICD-10-CM | POA: Diagnosis not present

## 2021-01-28 DIAGNOSIS — Z299 Encounter for prophylactic measures, unspecified: Secondary | ICD-10-CM | POA: Diagnosis not present

## 2021-01-28 DIAGNOSIS — I10 Essential (primary) hypertension: Secondary | ICD-10-CM | POA: Diagnosis not present

## 2021-01-28 DIAGNOSIS — F17210 Nicotine dependence, cigarettes, uncomplicated: Secondary | ICD-10-CM | POA: Diagnosis not present

## 2021-01-28 DIAGNOSIS — M7750 Other enthesopathy of unspecified foot: Secondary | ICD-10-CM | POA: Diagnosis not present

## 2021-02-23 DIAGNOSIS — I10 Essential (primary) hypertension: Secondary | ICD-10-CM | POA: Diagnosis not present

## 2021-03-25 DIAGNOSIS — I10 Essential (primary) hypertension: Secondary | ICD-10-CM | POA: Diagnosis not present

## 2021-04-24 DIAGNOSIS — I10 Essential (primary) hypertension: Secondary | ICD-10-CM | POA: Diagnosis not present

## 2021-06-27 DIAGNOSIS — Z7189 Other specified counseling: Secondary | ICD-10-CM | POA: Diagnosis not present

## 2021-06-27 DIAGNOSIS — Z79899 Other long term (current) drug therapy: Secondary | ICD-10-CM | POA: Diagnosis not present

## 2021-06-27 DIAGNOSIS — Z0000 Encounter for general adult medical examination without abnormal findings: Secondary | ICD-10-CM | POA: Diagnosis not present

## 2021-06-27 DIAGNOSIS — E7800 Pure hypercholesterolemia, unspecified: Secondary | ICD-10-CM | POA: Diagnosis not present

## 2021-06-27 DIAGNOSIS — Z299 Encounter for prophylactic measures, unspecified: Secondary | ICD-10-CM | POA: Diagnosis not present

## 2021-06-27 DIAGNOSIS — R5383 Other fatigue: Secondary | ICD-10-CM | POA: Diagnosis not present

## 2021-07-04 DIAGNOSIS — Z299 Encounter for prophylactic measures, unspecified: Secondary | ICD-10-CM | POA: Diagnosis not present

## 2021-07-04 DIAGNOSIS — I10 Essential (primary) hypertension: Secondary | ICD-10-CM | POA: Diagnosis not present

## 2021-07-04 DIAGNOSIS — R7303 Prediabetes: Secondary | ICD-10-CM | POA: Diagnosis not present

## 2021-07-04 DIAGNOSIS — H6122 Impacted cerumen, left ear: Secondary | ICD-10-CM | POA: Diagnosis not present

## 2021-07-24 DIAGNOSIS — I10 Essential (primary) hypertension: Secondary | ICD-10-CM | POA: Diagnosis not present

## 2021-08-12 DIAGNOSIS — I10 Essential (primary) hypertension: Secondary | ICD-10-CM | POA: Diagnosis not present

## 2021-08-12 DIAGNOSIS — Z299 Encounter for prophylactic measures, unspecified: Secondary | ICD-10-CM | POA: Diagnosis not present

## 2021-08-12 DIAGNOSIS — M50321 Other cervical disc degeneration at C4-C5 level: Secondary | ICD-10-CM | POA: Diagnosis not present

## 2021-08-12 DIAGNOSIS — M542 Cervicalgia: Secondary | ICD-10-CM | POA: Diagnosis not present

## 2021-08-12 DIAGNOSIS — F17210 Nicotine dependence, cigarettes, uncomplicated: Secondary | ICD-10-CM | POA: Diagnosis not present

## 2021-08-23 DIAGNOSIS — I10 Essential (primary) hypertension: Secondary | ICD-10-CM | POA: Diagnosis not present

## 2021-09-22 DIAGNOSIS — I10 Essential (primary) hypertension: Secondary | ICD-10-CM | POA: Diagnosis not present

## 2021-10-24 DIAGNOSIS — I10 Essential (primary) hypertension: Secondary | ICD-10-CM | POA: Diagnosis not present

## 2021-11-23 DIAGNOSIS — I10 Essential (primary) hypertension: Secondary | ICD-10-CM | POA: Diagnosis not present

## 2021-12-26 DIAGNOSIS — Z23 Encounter for immunization: Secondary | ICD-10-CM | POA: Diagnosis not present

## 2021-12-26 DIAGNOSIS — F17210 Nicotine dependence, cigarettes, uncomplicated: Secondary | ICD-10-CM | POA: Diagnosis not present

## 2021-12-26 DIAGNOSIS — Z0000 Encounter for general adult medical examination without abnormal findings: Secondary | ICD-10-CM | POA: Diagnosis not present

## 2021-12-26 DIAGNOSIS — Z299 Encounter for prophylactic measures, unspecified: Secondary | ICD-10-CM | POA: Diagnosis not present

## 2021-12-26 DIAGNOSIS — Z713 Dietary counseling and surveillance: Secondary | ICD-10-CM | POA: Diagnosis not present

## 2021-12-26 DIAGNOSIS — I10 Essential (primary) hypertension: Secondary | ICD-10-CM | POA: Diagnosis not present

## 2022-01-23 DIAGNOSIS — I10 Essential (primary) hypertension: Secondary | ICD-10-CM | POA: Diagnosis not present

## 2022-02-22 DIAGNOSIS — I10 Essential (primary) hypertension: Secondary | ICD-10-CM | POA: Diagnosis not present

## 2022-03-24 DIAGNOSIS — I10 Essential (primary) hypertension: Secondary | ICD-10-CM | POA: Diagnosis not present

## 2022-04-03 DIAGNOSIS — J069 Acute upper respiratory infection, unspecified: Secondary | ICD-10-CM | POA: Diagnosis not present

## 2022-04-03 DIAGNOSIS — Z87891 Personal history of nicotine dependence: Secondary | ICD-10-CM | POA: Diagnosis not present

## 2022-04-03 DIAGNOSIS — Z299 Encounter for prophylactic measures, unspecified: Secondary | ICD-10-CM | POA: Diagnosis not present

## 2022-04-03 DIAGNOSIS — E7800 Pure hypercholesterolemia, unspecified: Secondary | ICD-10-CM | POA: Diagnosis not present

## 2022-04-03 DIAGNOSIS — I10 Essential (primary) hypertension: Secondary | ICD-10-CM | POA: Diagnosis not present

## 2022-04-03 DIAGNOSIS — R509 Fever, unspecified: Secondary | ICD-10-CM | POA: Diagnosis not present

## 2022-04-12 DIAGNOSIS — U071 COVID-19: Secondary | ICD-10-CM | POA: Diagnosis not present

## 2022-04-12 DIAGNOSIS — F17210 Nicotine dependence, cigarettes, uncomplicated: Secondary | ICD-10-CM | POA: Diagnosis not present

## 2022-04-12 DIAGNOSIS — I10 Essential (primary) hypertension: Secondary | ICD-10-CM | POA: Diagnosis not present

## 2022-04-12 DIAGNOSIS — Z20822 Contact with and (suspected) exposure to covid-19: Secondary | ICD-10-CM | POA: Diagnosis not present

## 2022-04-12 DIAGNOSIS — Z299 Encounter for prophylactic measures, unspecified: Secondary | ICD-10-CM | POA: Diagnosis not present

## 2022-04-28 DIAGNOSIS — Z299 Encounter for prophylactic measures, unspecified: Secondary | ICD-10-CM | POA: Diagnosis not present

## 2022-04-28 DIAGNOSIS — F17210 Nicotine dependence, cigarettes, uncomplicated: Secondary | ICD-10-CM | POA: Diagnosis not present

## 2022-04-28 DIAGNOSIS — D492 Neoplasm of unspecified behavior of bone, soft tissue, and skin: Secondary | ICD-10-CM | POA: Diagnosis not present

## 2022-04-28 DIAGNOSIS — I10 Essential (primary) hypertension: Secondary | ICD-10-CM | POA: Diagnosis not present

## 2022-05-24 DIAGNOSIS — I10 Essential (primary) hypertension: Secondary | ICD-10-CM | POA: Diagnosis not present

## 2022-06-24 DIAGNOSIS — I10 Essential (primary) hypertension: Secondary | ICD-10-CM | POA: Diagnosis not present

## 2022-06-30 DIAGNOSIS — R5383 Other fatigue: Secondary | ICD-10-CM | POA: Diagnosis not present

## 2022-06-30 DIAGNOSIS — I10 Essential (primary) hypertension: Secondary | ICD-10-CM | POA: Diagnosis not present

## 2022-06-30 DIAGNOSIS — Z0000 Encounter for general adult medical examination without abnormal findings: Secondary | ICD-10-CM | POA: Diagnosis not present

## 2022-06-30 DIAGNOSIS — Z7189 Other specified counseling: Secondary | ICD-10-CM | POA: Diagnosis not present

## 2022-06-30 DIAGNOSIS — E7800 Pure hypercholesterolemia, unspecified: Secondary | ICD-10-CM | POA: Diagnosis not present

## 2022-06-30 DIAGNOSIS — F17210 Nicotine dependence, cigarettes, uncomplicated: Secondary | ICD-10-CM | POA: Diagnosis not present

## 2022-06-30 DIAGNOSIS — Z299 Encounter for prophylactic measures, unspecified: Secondary | ICD-10-CM | POA: Diagnosis not present

## 2022-06-30 DIAGNOSIS — Z79899 Other long term (current) drug therapy: Secondary | ICD-10-CM | POA: Diagnosis not present

## 2022-07-24 DIAGNOSIS — L570 Actinic keratosis: Secondary | ICD-10-CM | POA: Diagnosis not present

## 2022-07-24 DIAGNOSIS — X32XXXA Exposure to sunlight, initial encounter: Secondary | ICD-10-CM | POA: Diagnosis not present

## 2022-07-24 DIAGNOSIS — L821 Other seborrheic keratosis: Secondary | ICD-10-CM | POA: Diagnosis not present

## 2022-07-24 DIAGNOSIS — B078 Other viral warts: Secondary | ICD-10-CM | POA: Diagnosis not present

## 2022-10-25 DIAGNOSIS — I10 Essential (primary) hypertension: Secondary | ICD-10-CM | POA: Diagnosis not present

## 2022-11-03 DIAGNOSIS — Z299 Encounter for prophylactic measures, unspecified: Secondary | ICD-10-CM | POA: Diagnosis not present

## 2022-11-03 DIAGNOSIS — R7303 Prediabetes: Secondary | ICD-10-CM | POA: Diagnosis not present

## 2022-11-03 DIAGNOSIS — I10 Essential (primary) hypertension: Secondary | ICD-10-CM | POA: Diagnosis not present

## 2022-11-03 DIAGNOSIS — E7800 Pure hypercholesterolemia, unspecified: Secondary | ICD-10-CM | POA: Diagnosis not present

## 2022-11-03 DIAGNOSIS — Z0000 Encounter for general adult medical examination without abnormal findings: Secondary | ICD-10-CM | POA: Diagnosis not present

## 2022-11-25 DIAGNOSIS — I10 Essential (primary) hypertension: Secondary | ICD-10-CM | POA: Diagnosis not present

## 2022-11-28 DIAGNOSIS — M542 Cervicalgia: Secondary | ICD-10-CM | POA: Diagnosis not present

## 2022-11-28 DIAGNOSIS — H532 Diplopia: Secondary | ICD-10-CM | POA: Diagnosis not present

## 2022-11-28 DIAGNOSIS — G43909 Migraine, unspecified, not intractable, without status migrainosus: Secondary | ICD-10-CM | POA: Diagnosis not present

## 2022-11-28 DIAGNOSIS — Z299 Encounter for prophylactic measures, unspecified: Secondary | ICD-10-CM | POA: Diagnosis not present

## 2022-11-28 DIAGNOSIS — I10 Essential (primary) hypertension: Secondary | ICD-10-CM | POA: Diagnosis not present

## 2022-12-01 DIAGNOSIS — I700 Atherosclerosis of aorta: Secondary | ICD-10-CM | POA: Diagnosis not present

## 2022-12-01 DIAGNOSIS — M50321 Other cervical disc degeneration at C4-C5 level: Secondary | ICD-10-CM | POA: Diagnosis not present

## 2022-12-01 DIAGNOSIS — S0990XA Unspecified injury of head, initial encounter: Secondary | ICD-10-CM | POA: Diagnosis not present

## 2022-12-01 DIAGNOSIS — R519 Headache, unspecified: Secondary | ICD-10-CM | POA: Diagnosis not present

## 2022-12-01 DIAGNOSIS — S199XXA Unspecified injury of neck, initial encounter: Secondary | ICD-10-CM | POA: Diagnosis not present

## 2022-12-01 DIAGNOSIS — G43909 Migraine, unspecified, not intractable, without status migrainosus: Secondary | ICD-10-CM | POA: Diagnosis not present

## 2022-12-01 DIAGNOSIS — M899 Disorder of bone, unspecified: Secondary | ICD-10-CM | POA: Diagnosis not present

## 2022-12-01 DIAGNOSIS — M47812 Spondylosis without myelopathy or radiculopathy, cervical region: Secondary | ICD-10-CM | POA: Diagnosis not present

## 2022-12-05 DIAGNOSIS — H532 Diplopia: Secondary | ICD-10-CM | POA: Diagnosis not present

## 2022-12-05 DIAGNOSIS — Z299 Encounter for prophylactic measures, unspecified: Secondary | ICD-10-CM | POA: Diagnosis not present

## 2022-12-05 DIAGNOSIS — R52 Pain, unspecified: Secondary | ICD-10-CM | POA: Diagnosis not present

## 2022-12-05 DIAGNOSIS — I10 Essential (primary) hypertension: Secondary | ICD-10-CM | POA: Diagnosis not present

## 2022-12-05 DIAGNOSIS — M542 Cervicalgia: Secondary | ICD-10-CM | POA: Diagnosis not present

## 2022-12-08 DIAGNOSIS — Z23 Encounter for immunization: Secondary | ICD-10-CM | POA: Diagnosis not present

## 2022-12-19 DIAGNOSIS — I10 Essential (primary) hypertension: Secondary | ICD-10-CM | POA: Diagnosis not present

## 2022-12-19 DIAGNOSIS — M542 Cervicalgia: Secondary | ICD-10-CM | POA: Diagnosis not present

## 2022-12-19 DIAGNOSIS — Z299 Encounter for prophylactic measures, unspecified: Secondary | ICD-10-CM | POA: Diagnosis not present

## 2022-12-25 DIAGNOSIS — I10 Essential (primary) hypertension: Secondary | ICD-10-CM | POA: Diagnosis not present

## 2023-01-24 DIAGNOSIS — I10 Essential (primary) hypertension: Secondary | ICD-10-CM | POA: Diagnosis not present

## 2023-02-23 DIAGNOSIS — I10 Essential (primary) hypertension: Secondary | ICD-10-CM | POA: Diagnosis not present

## 2023-03-21 DIAGNOSIS — R1110 Vomiting, unspecified: Secondary | ICD-10-CM | POA: Diagnosis not present

## 2023-03-21 DIAGNOSIS — N179 Acute kidney failure, unspecified: Secondary | ICD-10-CM | POA: Diagnosis not present

## 2023-03-21 DIAGNOSIS — Z20822 Contact with and (suspected) exposure to covid-19: Secondary | ICD-10-CM | POA: Diagnosis not present

## 2023-03-21 DIAGNOSIS — R197 Diarrhea, unspecified: Secondary | ICD-10-CM | POA: Diagnosis not present

## 2023-03-21 DIAGNOSIS — R55 Syncope and collapse: Secondary | ICD-10-CM | POA: Diagnosis not present

## 2023-03-21 DIAGNOSIS — F17210 Nicotine dependence, cigarettes, uncomplicated: Secondary | ICD-10-CM | POA: Diagnosis not present

## 2023-03-21 DIAGNOSIS — E860 Dehydration: Secondary | ICD-10-CM | POA: Diagnosis not present

## 2023-03-21 DIAGNOSIS — E785 Hyperlipidemia, unspecified: Secondary | ICD-10-CM | POA: Diagnosis not present

## 2023-03-21 DIAGNOSIS — Z1152 Encounter for screening for COVID-19: Secondary | ICD-10-CM | POA: Diagnosis not present

## 2023-03-21 DIAGNOSIS — I10 Essential (primary) hypertension: Secondary | ICD-10-CM | POA: Diagnosis not present

## 2023-03-21 DIAGNOSIS — R112 Nausea with vomiting, unspecified: Secondary | ICD-10-CM | POA: Diagnosis not present

## 2023-03-23 DIAGNOSIS — I10 Essential (primary) hypertension: Secondary | ICD-10-CM | POA: Diagnosis not present

## 2023-03-23 DIAGNOSIS — Z299 Encounter for prophylactic measures, unspecified: Secondary | ICD-10-CM | POA: Diagnosis not present

## 2023-03-23 DIAGNOSIS — I700 Atherosclerosis of aorta: Secondary | ICD-10-CM | POA: Diagnosis not present

## 2023-03-23 DIAGNOSIS — K529 Noninfective gastroenteritis and colitis, unspecified: Secondary | ICD-10-CM | POA: Diagnosis not present

## 2023-03-26 DIAGNOSIS — I10 Essential (primary) hypertension: Secondary | ICD-10-CM | POA: Diagnosis not present

## 2023-04-25 DIAGNOSIS — I10 Essential (primary) hypertension: Secondary | ICD-10-CM | POA: Diagnosis not present

## 2023-05-08 DIAGNOSIS — R0989 Other specified symptoms and signs involving the circulatory and respiratory systems: Secondary | ICD-10-CM | POA: Diagnosis not present

## 2023-05-08 DIAGNOSIS — J069 Acute upper respiratory infection, unspecified: Secondary | ICD-10-CM | POA: Diagnosis not present

## 2023-05-08 DIAGNOSIS — I10 Essential (primary) hypertension: Secondary | ICD-10-CM | POA: Diagnosis not present

## 2023-05-08 DIAGNOSIS — Z299 Encounter for prophylactic measures, unspecified: Secondary | ICD-10-CM | POA: Diagnosis not present

## 2023-05-08 DIAGNOSIS — F17210 Nicotine dependence, cigarettes, uncomplicated: Secondary | ICD-10-CM | POA: Diagnosis not present

## 2023-05-16 DIAGNOSIS — I700 Atherosclerosis of aorta: Secondary | ICD-10-CM | POA: Diagnosis not present

## 2023-05-16 DIAGNOSIS — I10 Essential (primary) hypertension: Secondary | ICD-10-CM | POA: Diagnosis not present

## 2023-05-16 DIAGNOSIS — Z299 Encounter for prophylactic measures, unspecified: Secondary | ICD-10-CM | POA: Diagnosis not present

## 2023-05-25 DIAGNOSIS — I10 Essential (primary) hypertension: Secondary | ICD-10-CM | POA: Diagnosis not present

## 2023-06-24 DIAGNOSIS — I10 Essential (primary) hypertension: Secondary | ICD-10-CM | POA: Diagnosis not present

## 2023-07-06 DIAGNOSIS — Z299 Encounter for prophylactic measures, unspecified: Secondary | ICD-10-CM | POA: Diagnosis not present

## 2023-07-06 DIAGNOSIS — I10 Essential (primary) hypertension: Secondary | ICD-10-CM | POA: Diagnosis not present

## 2023-07-06 DIAGNOSIS — H6122 Impacted cerumen, left ear: Secondary | ICD-10-CM | POA: Diagnosis not present

## 2023-07-06 DIAGNOSIS — R42 Dizziness and giddiness: Secondary | ICD-10-CM | POA: Diagnosis not present

## 2023-07-09 DIAGNOSIS — Z79899 Other long term (current) drug therapy: Secondary | ICD-10-CM | POA: Diagnosis not present

## 2023-07-09 DIAGNOSIS — Z299 Encounter for prophylactic measures, unspecified: Secondary | ICD-10-CM | POA: Diagnosis not present

## 2023-07-09 DIAGNOSIS — Z0000 Encounter for general adult medical examination without abnormal findings: Secondary | ICD-10-CM | POA: Diagnosis not present

## 2023-07-09 DIAGNOSIS — R5383 Other fatigue: Secondary | ICD-10-CM | POA: Diagnosis not present

## 2023-07-09 DIAGNOSIS — E7800 Pure hypercholesterolemia, unspecified: Secondary | ICD-10-CM | POA: Diagnosis not present

## 2023-07-09 DIAGNOSIS — I10 Essential (primary) hypertension: Secondary | ICD-10-CM | POA: Diagnosis not present

## 2023-07-09 DIAGNOSIS — Z7189 Other specified counseling: Secondary | ICD-10-CM | POA: Diagnosis not present

## 2023-07-25 DIAGNOSIS — I10 Essential (primary) hypertension: Secondary | ICD-10-CM | POA: Diagnosis not present

## 2023-08-24 DIAGNOSIS — Z299 Encounter for prophylactic measures, unspecified: Secondary | ICD-10-CM | POA: Diagnosis not present

## 2023-08-24 DIAGNOSIS — I10 Essential (primary) hypertension: Secondary | ICD-10-CM | POA: Diagnosis not present

## 2023-08-24 DIAGNOSIS — R7303 Prediabetes: Secondary | ICD-10-CM | POA: Diagnosis not present

## 2023-08-24 DIAGNOSIS — F17210 Nicotine dependence, cigarettes, uncomplicated: Secondary | ICD-10-CM | POA: Diagnosis not present

## 2023-08-25 DIAGNOSIS — I10 Essential (primary) hypertension: Secondary | ICD-10-CM | POA: Diagnosis not present

## 2023-09-24 DIAGNOSIS — I10 Essential (primary) hypertension: Secondary | ICD-10-CM | POA: Diagnosis not present

## 2023-10-01 DIAGNOSIS — L821 Other seborrheic keratosis: Secondary | ICD-10-CM | POA: Diagnosis not present

## 2023-10-01 DIAGNOSIS — D1801 Hemangioma of skin and subcutaneous tissue: Secondary | ICD-10-CM | POA: Diagnosis not present

## 2023-10-01 DIAGNOSIS — L820 Inflamed seborrheic keratosis: Secondary | ICD-10-CM | POA: Diagnosis not present

## 2023-10-01 DIAGNOSIS — E1165 Type 2 diabetes mellitus with hyperglycemia: Secondary | ICD-10-CM | POA: Diagnosis not present

## 2023-10-01 DIAGNOSIS — R7303 Prediabetes: Secondary | ICD-10-CM | POA: Diagnosis not present

## 2023-10-01 DIAGNOSIS — Z299 Encounter for prophylactic measures, unspecified: Secondary | ICD-10-CM | POA: Diagnosis not present

## 2023-10-01 DIAGNOSIS — I10 Essential (primary) hypertension: Secondary | ICD-10-CM | POA: Diagnosis not present

## 2023-10-01 DIAGNOSIS — L579 Skin changes due to chronic exposure to nonionizing radiation, unspecified: Secondary | ICD-10-CM | POA: Diagnosis not present

## 2023-10-01 DIAGNOSIS — D485 Neoplasm of uncertain behavior of skin: Secondary | ICD-10-CM | POA: Diagnosis not present

## 2023-10-15 DIAGNOSIS — C4442 Squamous cell carcinoma of skin of scalp and neck: Secondary | ICD-10-CM | POA: Diagnosis not present

## 2023-10-22 DIAGNOSIS — C44329 Squamous cell carcinoma of skin of other parts of face: Secondary | ICD-10-CM | POA: Diagnosis not present

## 2023-10-25 DIAGNOSIS — I10 Essential (primary) hypertension: Secondary | ICD-10-CM | POA: Diagnosis not present

## 2023-11-09 DIAGNOSIS — R252 Cramp and spasm: Secondary | ICD-10-CM | POA: Diagnosis not present

## 2023-11-09 DIAGNOSIS — Z0000 Encounter for general adult medical examination without abnormal findings: Secondary | ICD-10-CM | POA: Diagnosis not present

## 2023-11-09 DIAGNOSIS — E1169 Type 2 diabetes mellitus with other specified complication: Secondary | ICD-10-CM | POA: Diagnosis not present

## 2023-11-09 DIAGNOSIS — I10 Essential (primary) hypertension: Secondary | ICD-10-CM | POA: Diagnosis not present

## 2023-11-09 DIAGNOSIS — Z299 Encounter for prophylactic measures, unspecified: Secondary | ICD-10-CM | POA: Diagnosis not present

## 2023-12-24 DIAGNOSIS — R011 Cardiac murmur, unspecified: Secondary | ICD-10-CM | POA: Diagnosis not present

## 2023-12-24 DIAGNOSIS — Z299 Encounter for prophylactic measures, unspecified: Secondary | ICD-10-CM | POA: Diagnosis not present

## 2023-12-24 DIAGNOSIS — I10 Essential (primary) hypertension: Secondary | ICD-10-CM | POA: Diagnosis not present

## 2023-12-24 DIAGNOSIS — E1169 Type 2 diabetes mellitus with other specified complication: Secondary | ICD-10-CM | POA: Diagnosis not present

## 2023-12-24 DIAGNOSIS — Z23 Encounter for immunization: Secondary | ICD-10-CM | POA: Diagnosis not present

## 2023-12-25 DIAGNOSIS — I10 Essential (primary) hypertension: Secondary | ICD-10-CM | POA: Diagnosis not present

## 2024-01-07 DIAGNOSIS — I10 Essential (primary) hypertension: Secondary | ICD-10-CM | POA: Diagnosis not present

## 2024-01-07 DIAGNOSIS — Z299 Encounter for prophylactic measures, unspecified: Secondary | ICD-10-CM | POA: Diagnosis not present

## 2024-01-07 DIAGNOSIS — E119 Type 2 diabetes mellitus without complications: Secondary | ICD-10-CM | POA: Diagnosis not present

## 2024-01-14 DIAGNOSIS — R011 Cardiac murmur, unspecified: Secondary | ICD-10-CM | POA: Diagnosis not present

## 2024-04-15 ENCOUNTER — Other Ambulatory Visit: Payer: Self-pay | Admitting: *Deleted

## 2024-04-15 DIAGNOSIS — K4020 Bilateral inguinal hernia, without obstruction or gangrene, not specified as recurrent: Secondary | ICD-10-CM

## 2024-04-29 ENCOUNTER — Ambulatory Visit: Admitting: Surgery

## 2024-04-29 ENCOUNTER — Encounter: Payer: Self-pay | Admitting: Surgery

## 2024-04-29 VITALS — BP 159/83 | HR 66 | Temp 98.0°F | Resp 14 | Ht 72.0 in | Wt 176.0 lb

## 2024-04-29 DIAGNOSIS — K4020 Bilateral inguinal hernia, without obstruction or gangrene, not specified as recurrent: Secondary | ICD-10-CM | POA: Diagnosis not present

## 2024-06-11 ENCOUNTER — Other Ambulatory Visit (HOSPITAL_COMMUNITY)

## 2024-06-16 ENCOUNTER — Ambulatory Visit (HOSPITAL_COMMUNITY): Admit: 2024-06-16 | Admitting: Surgery
